# Patient Record
Sex: Male | Born: 1980 | Race: Black or African American | Hispanic: No | Marital: Single | Smoking: Current every day smoker
Health system: Southern US, Community
[De-identification: ages and names within clinical notes are randomized; demographics above are authoritative.]

## PROBLEM LIST (undated history)

## (undated) HISTORY — PX: SKIN GRAFT: SHX250

---

## 2004-12-26 ENCOUNTER — Emergency Department (HOSPITAL_COMMUNITY): Admission: EM | Admit: 2004-12-26 | Discharge: 2004-12-26 | Payer: Self-pay | Admitting: Emergency Medicine

## 2004-12-29 ENCOUNTER — Ambulatory Visit: Payer: Self-pay | Admitting: Orthopedic Surgery

## 2005-01-01 ENCOUNTER — Ambulatory Visit (HOSPITAL_COMMUNITY): Admission: RE | Admit: 2005-01-01 | Discharge: 2005-01-02 | Payer: Self-pay | Admitting: Orthopedic Surgery

## 2005-06-10 ENCOUNTER — Emergency Department (HOSPITAL_COMMUNITY): Admission: EM | Admit: 2005-06-10 | Discharge: 2005-06-10 | Payer: Self-pay | Admitting: Emergency Medicine

## 2005-08-11 ENCOUNTER — Emergency Department (HOSPITAL_COMMUNITY): Admission: EM | Admit: 2005-08-11 | Discharge: 2005-08-11 | Payer: Self-pay | Admitting: Emergency Medicine

## 2005-08-30 ENCOUNTER — Ambulatory Visit: Payer: Self-pay | Admitting: Family Medicine

## 2006-06-20 ENCOUNTER — Encounter (INDEPENDENT_AMBULATORY_CARE_PROVIDER_SITE_OTHER): Payer: Self-pay | Admitting: Family Medicine

## 2006-07-18 ENCOUNTER — Ambulatory Visit: Payer: Self-pay | Admitting: Family Medicine

## 2006-07-21 ENCOUNTER — Encounter (INDEPENDENT_AMBULATORY_CARE_PROVIDER_SITE_OTHER): Payer: Self-pay | Admitting: Family Medicine

## 2006-07-21 LAB — CONVERTED CEMR LAB: WBC, blood: 5.8 10*3/uL

## 2006-07-27 ENCOUNTER — Encounter: Payer: Self-pay | Admitting: Family Medicine

## 2006-07-27 DIAGNOSIS — E669 Obesity, unspecified: Secondary | ICD-10-CM

## 2006-07-27 DIAGNOSIS — F17200 Nicotine dependence, unspecified, uncomplicated: Secondary | ICD-10-CM

## 2006-07-27 DIAGNOSIS — M545 Low back pain: Secondary | ICD-10-CM

## 2006-08-21 ENCOUNTER — Ambulatory Visit: Payer: Self-pay | Admitting: Family Medicine

## 2006-09-05 ENCOUNTER — Emergency Department (HOSPITAL_COMMUNITY): Admission: EM | Admit: 2006-09-05 | Discharge: 2006-09-05 | Payer: Self-pay | Admitting: Emergency Medicine

## 2007-08-16 ENCOUNTER — Encounter: Payer: Self-pay | Admitting: Family Medicine

## 2007-12-07 ENCOUNTER — Ambulatory Visit: Payer: Self-pay | Admitting: Family Medicine

## 2007-12-07 DIAGNOSIS — R197 Diarrhea, unspecified: Secondary | ICD-10-CM

## 2007-12-07 DIAGNOSIS — F341 Dysthymic disorder: Secondary | ICD-10-CM

## 2007-12-07 DIAGNOSIS — F528 Other sexual dysfunction not due to a substance or known physiological condition: Secondary | ICD-10-CM

## 2007-12-07 DIAGNOSIS — F1220 Cannabis dependence, uncomplicated: Secondary | ICD-10-CM | POA: Insufficient documentation

## 2007-12-08 ENCOUNTER — Encounter (INDEPENDENT_AMBULATORY_CARE_PROVIDER_SITE_OTHER): Payer: Self-pay | Admitting: Family Medicine

## 2007-12-08 LAB — CONVERTED CEMR LAB
ALT: 32 units/L (ref 0–53)
AST: 21 units/L (ref 0–37)
Basophils Absolute: 0.1 10*3/uL (ref 0.0–0.1)
Basophils Relative: 1 % (ref 0–1)
Chloride: 108 meq/L (ref 96–112)
Creatinine, Ser: 0.97 mg/dL (ref 0.40–1.50)
Eosinophils Relative: 4 % (ref 0–5)
Hemoglobin: 13.9 g/dL (ref 13.0–17.0)
MCHC: 32.9 g/dL (ref 30.0–36.0)
Monocytes Absolute: 0.6 10*3/uL (ref 0.1–1.0)
Neutro Abs: 2.6 10*3/uL (ref 1.7–7.7)
Platelets: 358 10*3/uL (ref 150–400)
RDW: 12.6 % (ref 11.5–15.5)
Sodium: 140 meq/L (ref 135–145)
Total Bilirubin: 0.6 mg/dL (ref 0.3–1.2)

## 2007-12-10 ENCOUNTER — Telehealth (INDEPENDENT_AMBULATORY_CARE_PROVIDER_SITE_OTHER): Payer: Self-pay | Admitting: *Deleted

## 2007-12-12 ENCOUNTER — Telehealth (INDEPENDENT_AMBULATORY_CARE_PROVIDER_SITE_OTHER): Payer: Self-pay | Admitting: Family Medicine

## 2007-12-21 ENCOUNTER — Encounter (INDEPENDENT_AMBULATORY_CARE_PROVIDER_SITE_OTHER): Payer: Self-pay | Admitting: Family Medicine

## 2007-12-31 ENCOUNTER — Ambulatory Visit: Payer: Self-pay | Admitting: Family Medicine

## 2008-01-02 LAB — CONVERTED CEMR LAB
GC Probe Amp, Urine: NEGATIVE
HCV Ab: NEGATIVE
Hep A IgM: NEGATIVE

## 2008-02-06 ENCOUNTER — Ambulatory Visit: Payer: Self-pay | Admitting: Family Medicine

## 2008-02-11 ENCOUNTER — Telehealth (INDEPENDENT_AMBULATORY_CARE_PROVIDER_SITE_OTHER): Payer: Self-pay | Admitting: Family Medicine

## 2008-02-14 ENCOUNTER — Ambulatory Visit: Payer: Self-pay | Admitting: Family Medicine

## 2008-02-14 DIAGNOSIS — B009 Herpesviral infection, unspecified: Secondary | ICD-10-CM | POA: Insufficient documentation

## 2008-05-07 ENCOUNTER — Ambulatory Visit: Payer: Self-pay | Admitting: Family Medicine

## 2008-05-07 DIAGNOSIS — M79609 Pain in unspecified limb: Secondary | ICD-10-CM

## 2009-10-23 ENCOUNTER — Emergency Department (HOSPITAL_COMMUNITY): Admission: EM | Admit: 2009-10-23 | Discharge: 2009-10-23 | Payer: Self-pay | Admitting: Emergency Medicine

## 2010-09-14 NOTE — Letter (Signed)
Summary: rpc chart  rpc chart   Imported By: Curtis Sites 05/24/2010 11:37:09  _____________________________________________________________________  External Attachment:    Type:   Image     Comment:   External Document

## 2010-12-31 NOTE — Op Note (Signed)
NAMEKEIMARI, Regino Ramirez           ACCOUNT NO.:  [id number]   MEDICAL RECORD NO.:  [id number]          PATIENT TYPE:  OIB   LOCATION:  5022                         FACILITY:  [facility]   PHYSICIAN:  Dionne Ano. Gramig III, M.D.DATE OF BIRTH:  07/03/1981   DATE OF PROCEDURE:  DATE OF DISCHARGE:  01/02/2005                                 OPERATIVE REPORT   PREOPERATIVE DIAGNOSIS:  Severe avulsive injury, right small finger proximal  interphalangeal joint, with 1 cm loss of extensor tendon, open proximal  interphalangeal joint, loss of dorsal skin and subcutaneous tissue.   POSTOPERATIVE DIAGNOSIS:  Severe avulsive injury, right small finger  proximal interphalangeal joint, with 1 cm loss of extensor tendon, open  proximal interphalangeal joint, loss of dorsal skin and subcutaneous tissue.   PROCEDURES:  1.  Irrigation and debridement, right small finger proximal interphalangeal      joint, skin and subcutaneous tissue, tendon, and open proximal      interphalangeal joint.  2.  Right small finger zone 3 tendon repair with palmaris longus tendon      graft.  The palmaris longus tendon graft was obtained from the distal      forearm.  3.  Right small finger transposition flap with 2 x 1 cm full-thickness skin      graft being placed in the proximal transposed area.  This was a dorsal      proximal and distal flap.  4.  Right small finger transposition flap with full-thickness skin graft      __________ a 1 x 1 cm defect, which was a distal to proximal flap (two      distinct and separate transposition flaps were performed in order to      salvage the digit and get primary coverage for the proximal      interphalangeal joint, which had noted avulsed tissue).  5.  Pinning, right small finger.  6.  Stress radiography/fluoroscopy.  7.  Skin grafting, full-thickness in nature, 2 x 1 cm proximal area and a 1      x 2 cm distal area where the two transposition flaps were performed.   SURGEON:   Dionne Ano. Amanda Pea, M.D.   ASSISTANT:  None.   COMPLICATIONS:  None.   ANESTHESIA:  General.   SPECIMENS:  None.   INDICATION FOR PROCEDURE:  This patient is a very pleasant male who presents  with the above-mentioned diagnosis.  I have counseled him in regard to risks  and benefits of surgery, including risk of infection, bleeding, anesthesia,  damage to normal structures and failure of the surgery to accomplish its  intended goals of relieving symptoms and restoring function.  With this in  mind, he desires to proceed.  All questions have been encouraged and  answered preoperatively.  This patient was initially triaged at [facility].  The patient was seen by Vickki Hearing, M.D., and referred  for our care for upper extremity salvage.  I have discussed with the patient  all risks and benefits, including loss of digit, infection, chronic pain,  stiffness, and other problems.  Unfortunately, he has  had a delay and an  open PIP joint for quite some time.  Immediately upon seeing him, I I&D'd  him, placed him on antibiotics and set him up for this reconstructive  effort.   OPERATIVE PROCEDURE IN DETAIL:  He was seen by myself and anesthesia,  consent was signed.  He was taken to the operative suite, given preoperative  antibiotics and once this was done was placed supine, underwent a general  anesthesia under the direction of the anesthesia department, and a sterile  field was secured after appropriate padding and surgical preparation.  Once  a sterile field was secured, the patient underwent I&D of the PIP joint,  tendon and subcu and skin.  There was a large amount of devitalized tissue  which was removed.  This created a 1.5 x 1.5 cm defect.  Multiple washes  were performed and greater than 2.5-3 L were placed in the wound.  Once this  was done, towels were changed and the patient then underwent evaluation of  the tissue.  I noted immediately that the patient would  require a  transposition flap, and thus the first flap was created by incising  laterally and ulnarly about the finger.  This was brought up to just shy of  the MCP level and then I made a back cut radially toward the midlateral  line.  I mobilized this, taking care to preserve the peritenon.  This was  able to get across almost two-thirds of the defect.  This left a 2 x 1 cm  area which would require full-thickness skin grafting.  I then made a second  transposition flap distally.  This was distal to the defect, and an incision  was made radially about the dorsal aspect of the small finger and then a  back cut toward the midlateral line ulnarly was performed.  This was  elevated and taken off of the peritenon.  Once this was done, I then  mobilized it and I then noted that I could cover the wound fairly nicely.  Once this was done, I then turned attention toward the joint and reduced it  and placed a 0.045 K-wire across it.  This was a pinning of the right small  finger PIP joint as treatment for subluxation and dislocation tendency of  the small finger due to the extensor loss.  Once this was done, the patient  had attention turned toward the volar forearm.  I placed a tourniquet,  irrigated copiously, and placed wet laps on the small finger.  I then pin-  capped, checked it under fluoroscopy.  I was pleased with this and then made  an incision volarly about the distal third of the forearm.  I dissected  down.  In doing so, the incision removed a 2.5 x 3 cm area of skin.  I then  dissected out the palmaris longus, obtained a tendon graft.  This was a two-  thirds portion of the palmaris longus.  Once this was done, I then placed  this in a wet lap and moved back to the main field.  Prior to doing so, I  should note I irrigated copiously and closed the volar wound with  interrupted Vicryl, followed by Prolene.  Once this was done, I then performed a tendon graft repair in zone 3 by filling  in the defects with  palmaris longus graft, which was able to be looped 2-1/2 times through the  defect.  This was inset with tendon braider into  the proximal and distal  aspects of the extensor apparatus and sutured with Fibrewire.  Unfortunately, the patient did have periosteum exposed and thus will be at  high risk for tendon adhesions.  Once this was done and sutured to my  satisfaction, I then closed the transposition flaps primarily with Prolene  and then contoured the full-thickness skin grafts by defatting them and  performing a full-thickness skin graft, 2 x 1 cm proximally and 1 x 1 cm  distally.  This skin graft was defatted.  It was perforated to allow for the  egress of fluids, and it was sutured down with Prolene and chromic, followed  by a bolster dressing with Xeroform followed soaked cotton balls.  The  patient tolerated this well, and there were no complicating features.  This  will hopefully take nicely.  The peritenon was preserved.  The transposition  flaps x2 covered the defect fairly nicely.  All flaps had good refill, and  there were no complicating features.  Once this was done and the circulation  looked excellent to the finger, I placed him in a sterile dressing and a  volar plaster splint with an additional finger splint volarly applied for  mobilization purposes.  He tolerated the procedure well, awoke from  anesthesia without complicating features, and was taken to the recovery  room.  He will be monitored here and observed overnight.  He will be placed  on IV antibiotics, one aspirin a day.  I will ask him to stop smoking and  have discussed this with him previously.  In addition to this, will have  pain medicine according to his needs, etc.  All questions have been  encouraged and answered.      WMG/MEDQ  D:  01/03/2005  T:  01/03/2005  Job:  [id number]

## 2016-09-15 ENCOUNTER — Encounter: Payer: Self-pay | Admitting: Medical

## 2018-08-22 DIAGNOSIS — F17210 Nicotine dependence, cigarettes, uncomplicated: Secondary | ICD-10-CM | POA: Insufficient documentation

## 2018-08-22 DIAGNOSIS — N200 Calculus of kidney: Secondary | ICD-10-CM | POA: Insufficient documentation

## 2018-08-23 ENCOUNTER — Emergency Department (HOSPITAL_COMMUNITY): Payer: Self-pay

## 2018-08-23 ENCOUNTER — Encounter (HOSPITAL_COMMUNITY): Payer: Self-pay | Admitting: *Deleted

## 2018-08-23 ENCOUNTER — Emergency Department (HOSPITAL_COMMUNITY)
Admission: EM | Admit: 2018-08-23 | Discharge: 2018-08-23 | Disposition: A | Discharge status: Home / Self Care | Payer: Self-pay | Attending: Emergency Medicine | Admitting: Emergency Medicine

## 2018-08-23 ENCOUNTER — Other Ambulatory Visit: Payer: Self-pay

## 2018-08-23 DIAGNOSIS — N200 Calculus of kidney: Secondary | ICD-10-CM

## 2018-08-23 LAB — URINALYSIS, ROUTINE W REFLEX MICROSCOPIC
Bacteria, UA: NONE SEEN
Bilirubin Urine: NEGATIVE
GLUCOSE, UA: NEGATIVE mg/dL
KETONES UR: NEGATIVE mg/dL
Leukocytes, UA: NEGATIVE
NITRITE: NEGATIVE
Protein, ur: NEGATIVE mg/dL
Specific Gravity, Urine: 1.017 (ref 1.005–1.030)
pH: 5 (ref 5.0–8.0)

## 2018-08-23 NOTE — Discharge Instructions (Addendum)
Return to the emergency department if you develop fever, worsening pain, or other new and concerning symptoms.

## 2018-08-23 NOTE — ED Provider Notes (Signed)
[facility] EMERGENCY DEPARTMENT Provider Note   CSN: [id number] Arrival date & time: 08/22/18  2348     History   Chief Complaint Chief Complaint  Patient presents with  . Testicle Pain    HPI Christopher York is a 38 y.o. male.  Patient is a 38 year old male with no significant past medical history.  He presents today for evaluation of abdominal and testicle pain.  He states this morning while he was at work he developed sharp pain in his testicles, followed by pain radiating to his right flank.  This is occurred intermittently throughout the day and presents for evaluation of it.  He denies any dysuria or hematuria.  He denies any fevers or chills.  The history is provided by the patient.  Testicle Pain  This is a new problem. Episode onset: 9 AM. Episode frequency: Intermittently. Associated symptoms include abdominal pain. Nothing aggravates the symptoms. Nothing relieves the symptoms. He has tried nothing for the symptoms.    History reviewed. No pertinent past medical history.  Patient Active Problem List   Diagnosis Date Noted  . FINGER PAIN 05/07/2008  . COLD SORE 02/14/2008  . ANXIETY DEPRESSION 12/07/2007  . ERECTILE DYSFUNCTION 12/07/2007  . CANNABIS DEPENDENCE UNSPECIFIED ABUSE 12/07/2007  . LOOSE STOOLS 12/07/2007  . OBESITY NOS 07/27/2006  . DISORDER, TOBACCO USE 07/27/2006  . LOW BACK PAIN 07/27/2006    Past Surgical History:  Procedure Laterality Date  . SKIN GRAFT          Home Medications    Prior to Admission medications   Not on File    Family History History reviewed. No pertinent family history.  Social History Social History   Tobacco Use  . Smoking status: Current Every Day Smoker    Packs/day: 0.50  . Smokeless tobacco: Never Used  Substance Use Topics  . Alcohol use: Yes    Comment: occasionally  . Drug use: Yes    Types: Marijuana     Allergies   Penicillins   Review of Systems Review of Systems    Gastrointestinal: Positive for abdominal pain.  Genitourinary: Positive for testicular pain.  All other systems reviewed and are negative.    Physical Exam Updated Vital Signs BP (!) 147/101 (BP Location: Right Arm)   Pulse 61   Temp 98.2 F (36.8 C) (Oral)   Resp 18   Ht 5\' 9"  (1.753 m)   Wt 107.5 kg   SpO2 100%   BMI 35.00 kg/m   Physical Exam Vitals signs and nursing note reviewed.  Constitutional:      General: He is not in acute distress.    Appearance: He is well-developed. He is not diaphoretic.  HENT:     Head: Normocephalic and atraumatic.  Neck:     Musculoskeletal: Normal range of motion and neck supple.  Cardiovascular:     Rate and Rhythm: Normal rate and regular rhythm.     Heart sounds: No murmur. No friction rub.  Pulmonary:     Effort: Pulmonary effort is normal. No respiratory distress.     Breath sounds: Normal breath sounds. No wheezing or rales.  Abdominal:     General: Bowel sounds are normal. There is no distension.     Palpations: Abdomen is soft.     Tenderness: There is no abdominal tenderness.  Genitourinary:    Penis: Normal.      Scrotum/Testes: Normal.     Comments: External genitalia is normal in appearance.  There is no  swelling.  I am unable to palpate any hernia.  Testes are freely mobile and are nontender. Musculoskeletal: Normal range of motion.  Skin:    General: Skin is warm and dry.  Neurological:     Mental Status: He is alert and oriented to person, place, and time.     Coordination: Coordination normal.      ED Treatments / Results  Labs (all labs ordered are listed, but only abnormal results are displayed) Labs Reviewed  URINALYSIS, ROUTINE W REFLEX MICROSCOPIC  GC/CHLAMYDIA PROBE AMP ([place]) NOT AT [facility]    EKG None  Radiology No results found.  Procedures Procedures (including critical care time)  Medications Ordered in ED Medications - No data to display   Initial Impression / Assessment and  Plan / ED Course  I have reviewed the triage vital signs and the nursing notes.  Pertinent labs & imaging results that were available during my care of the patient were reviewed by me and considered in my medical decision making (see chart for details).  Patient presents here with complaints of testicular pain radiating to his right flank.  Based on the results of the CT scan and urinalysis, it would appear as though he had a renal calculus that appears to be now in the bladder.  He is now comfortable and not complaining of any discomfort.  At this point, I feel as though he is appropriate for discharge.  To follow-up as needed for any problems.  Final Clinical Impressions(s) / ED Diagnoses   Final diagnoses:  None    ED Discharge Orders    None       Geoffery Lyons, MD 08/23/18 [phone]

## 2018-08-23 NOTE — ED Triage Notes (Signed)
Pt states he was at work when he had a severe pain to his right testicle; pt states the pain went away but came back and is now having right side abdominal pain with the testicle

## 2018-08-24 LAB — GC/CHLAMYDIA PROBE AMP (~~LOC~~) NOT AT ARMC
Chlamydia: NEGATIVE
Neisseria Gonorrhea: NEGATIVE

## 2019-05-22 ENCOUNTER — Emergency Department (HOSPITAL_COMMUNITY)
Admission: EM | Admit: 2019-05-22 | Discharge: 2019-05-22 | Disposition: A | Discharge status: Home / Self Care | Payer: Self-pay | Attending: Emergency Medicine | Admitting: Emergency Medicine

## 2019-05-22 ENCOUNTER — Encounter (HOSPITAL_COMMUNITY): Payer: Self-pay | Admitting: Emergency Medicine

## 2019-05-22 ENCOUNTER — Other Ambulatory Visit: Payer: Self-pay

## 2019-05-22 DIAGNOSIS — L0231 Cutaneous abscess of buttock: Secondary | ICD-10-CM | POA: Insufficient documentation

## 2019-05-22 DIAGNOSIS — F17210 Nicotine dependence, cigarettes, uncomplicated: Secondary | ICD-10-CM | POA: Insufficient documentation

## 2019-05-22 MED ORDER — SULFAMETHOXAZOLE-TRIMETHOPRIM 800-160 MG PO TABS: 1.0000 | ORAL_TABLET | Freq: Two times a day (BID) | ORAL | 0 refills | Status: AC

## 2019-05-22 MED ORDER — OXYCODONE-ACETAMINOPHEN 5-325 MG PO TABS
1.0000 | ORAL_TABLET | Freq: Once | ORAL | Status: AC
  Administered 2019-05-22: 22:00:00 1 via ORAL
  Filled 2019-05-22: qty 1

## 2019-05-22 MED ORDER — SULFAMETHOXAZOLE-TRIMETHOPRIM 800-160 MG PO TABS
1.0000 | ORAL_TABLET | Freq: Once | ORAL | Status: AC
  Administered 2019-05-22: 22:00:00 1 via ORAL
  Filled 2019-05-22: qty 1

## 2019-05-22 MED ORDER — HYDROCODONE-ACETAMINOPHEN 5-325 MG PO TABS: 1.0000 | ORAL_TABLET | ORAL | 0 refills | Status: AC | PRN

## 2019-05-22 NOTE — Discharge Instructions (Signed)
Soak area 20 minutes 4 times a day  return if any problems.

## 2019-05-22 NOTE — ED Provider Notes (Signed)
[facility] EMERGENCY DEPARTMENT Provider Note   CSN: [id number] Arrival date & time: 05/22/19  2055     History   Chief Complaint Chief Complaint  Patient presents with  . Abscess    HPI Christopher York is a 38 y.o. male.     The history is provided by the patient. No language interpreter was used.  Abscess Location:  Pelvis Pelvic abscess location:  L buttock Size:  2 Abscess quality: draining and painful   Red streaking: no   Progression:  Worsening Pain details:    Quality:  No pain   Severity:  Moderate   Timing:  Constant   Progression:  Worsening Chronicity:  New Relieved by:  Nothing Worsened by:  Nothing Ineffective treatments:  None tried Associated symptoms: no nausea   Pt reports he has a swollen area on his buttock.  Pt reports he thinks something may have bitten him.    History reviewed. No pertinent past medical history.  Patient Active Problem List   Diagnosis Date Noted  . FINGER PAIN 05/07/2008  . COLD SORE 02/14/2008  . ANXIETY DEPRESSION 12/07/2007  . ERECTILE DYSFUNCTION 12/07/2007  . CANNABIS DEPENDENCE UNSPECIFIED ABUSE 12/07/2007  . LOOSE STOOLS 12/07/2007  . OBESITY NOS 07/27/2006  . DISORDER, TOBACCO USE 07/27/2006  . LOW BACK PAIN 07/27/2006    Past Surgical History:  Procedure Laterality Date  . SKIN GRAFT          Home Medications    Prior to Admission medications   Medication Sig Start Date End Date Taking? Authorizing Provider  HYDROcodone-acetaminophen (NORCO/VICODIN) 5-325 MG tablet Take 1 tablet by mouth every 4 (four) hours as needed. 05/22/19   Elson Areas, PA-C  sulfamethoxazole-trimethoprim (BACTRIM DS) 800-160 MG tablet Take 1 tablet by mouth 2 (two) times daily for 7 days. 05/22/19 05/29/19  Elson Areas, PA-C    Family History No family history on file.  Social History Social History   Tobacco Use  . Smoking status: Current Every Day Smoker    Packs/day: 0.50  . Smokeless tobacco: Never  Used  Substance Use Topics  . Alcohol use: Yes    Comment: occasionally  . Drug use: Not Currently    Types: Marijuana     Allergies   Penicillins   Review of Systems Review of Systems  Gastrointestinal: Negative for nausea.  All other systems reviewed and are negative.    Physical Exam Updated Vital Signs BP (!) 139/93 (BP Location: Right Arm)   Pulse (!) 110   Temp (!) 100.5 F (38.1 C) (Oral)   Resp 18   Ht 5\' 9"  (1.753 m)   Wt 98.9 kg   SpO2 100%   BMI 32.19 kg/m   Physical Exam Vitals signs and nursing note reviewed.  Constitutional:      Appearance: He is well-developed.  HENT:     Head: Normocephalic.  Neck:     Musculoskeletal: Normal range of motion.  Cardiovascular:     Rate and Rhythm: Normal rate.  Pulmonary:     Effort: Pulmonary effort is normal.  Abdominal:     General: There is no distension.  Musculoskeletal:        General: Swelling present.     Comments: 2cm swollen tender area left buttock,  Oozing,  Oozing increases with pressure   Neurological:     Mental Status: He is alert and oriented to person, place, and time.      ED Treatments / Results  Labs (  all labs ordered are listed, but only abnormal results are displayed) Labs Reviewed - No data to display  EKG None  Radiology No results found.  Procedures Procedures (including critical care time)  Medications Ordered in ED Medications  oxyCODONE-acetaminophen (PERCOCET/ROXICET) 5-325 MG per tablet 1 tablet (1 tablet Oral Given 05/22/19 2222)  sulfamethoxazole-trimethoprim (BACTRIM DS) 800-160 MG per tablet 1 tablet (1 tablet Oral Given 05/22/19 2222)     Initial Impression / Assessment and Plan / ED Course  I have reviewed the triage vital signs and the nursing notes.  Pertinent labs & imaging results that were available during my care of the patient were reviewed by me and considered in my medical decision making (see chart for details).        MDM  Pt counseled  on infection.  Pt advised to soak 20 minutes 4 times a day.  Pt given bactrim here and rx   Final Clinical Impressions(s) / ED Diagnoses   Final diagnoses:  Abscess of left buttock    ED Discharge Orders         Ordered    sulfamethoxazole-trimethoprim (BACTRIM DS) 800-160 MG tablet  2 times daily     05/22/19 2218    HYDROcodone-acetaminophen (NORCO/VICODIN) 5-325 MG tablet  Every 4 hours PRN     05/22/19 2218        An After Visit Summary was printed and given to the patient.    Fransico Meadow, PA-C 05/22/19 2251    Julianne Rice, MD 05/22/19 [phone]

## 2019-05-22 NOTE — ED Triage Notes (Signed)
Pt c/o abscess to the left buttocks x2 days.

## 2019-06-11 IMAGING — CT CT RENAL STONE PROTOCOL
2 of 3 series · 16 of 46 positions shown, 18 images · non-contrast
Comparison: None available.

CLINICAL DATA: Initial evaluation for acute flank pain, stone
disease suspected.

EXAM:
CT ABDOMEN AND PELVIS WITHOUT CONTRAST
TECHNIQUE: Multidetector CT imaging of the abdomen and pelvis was performed
following the standard protocol without IV contrast.

[Series 2: axial st · axial · 0.89mm/px · z∈[+1233,+1668]mm · 13 of 101 slices shown, 15 images]
[im 7/101  soft-tissue]
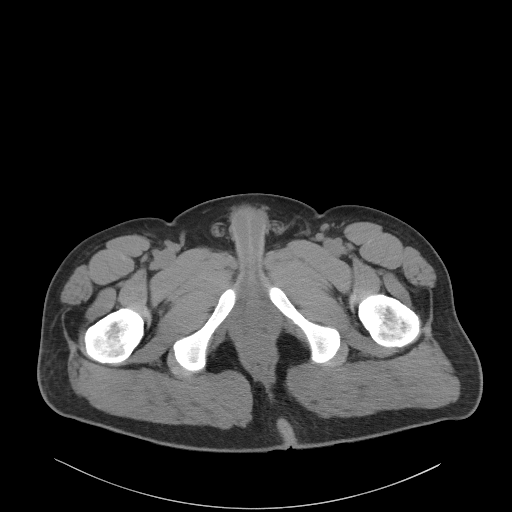
[im 7/101  bone]
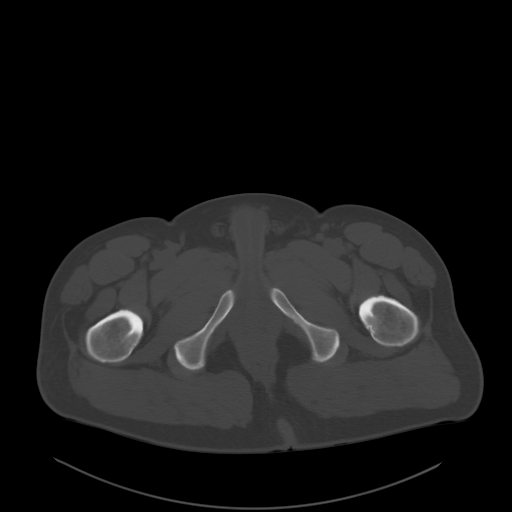
[im 13/101  soft-tissue]
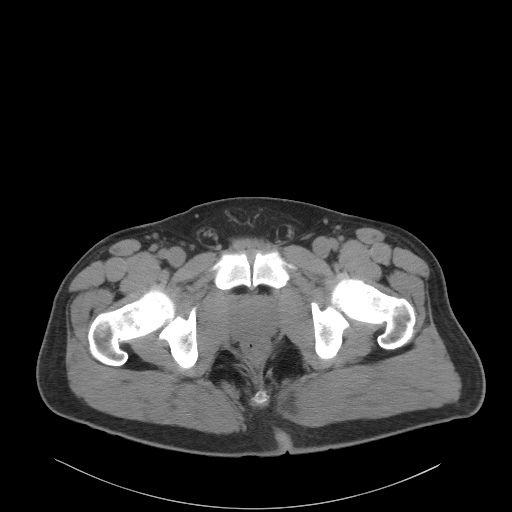
[im 20/101  soft-tissue]
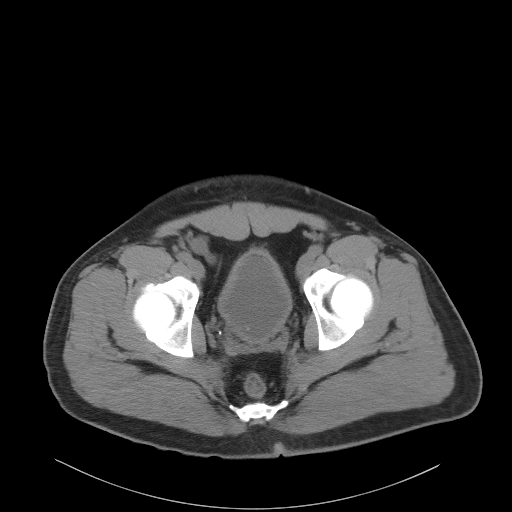
[im 30/101  soft-tissue]
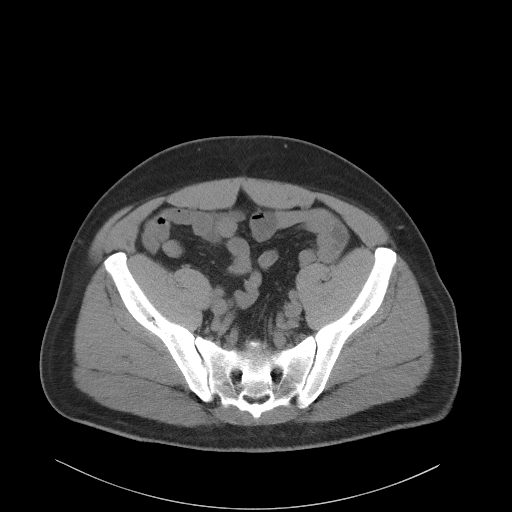
[im 36/101  soft-tissue]
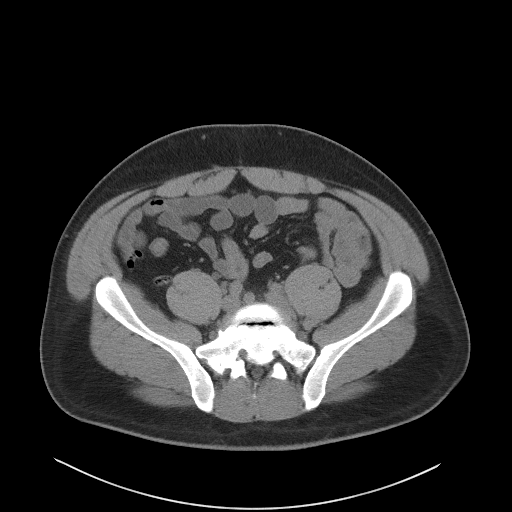
[im 42/101  soft-tissue]
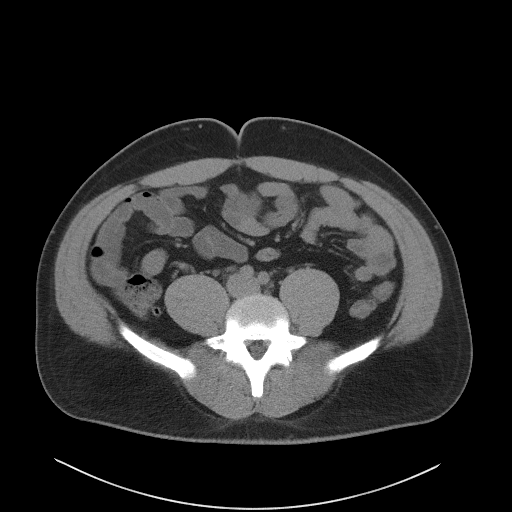
[im 52/101  soft-tissue]
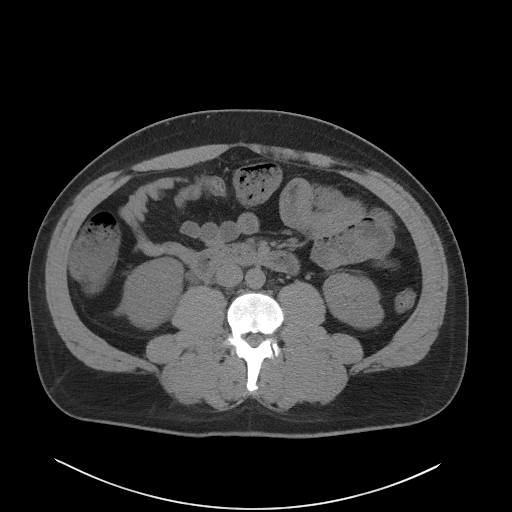
[im 59/101  soft-tissue]
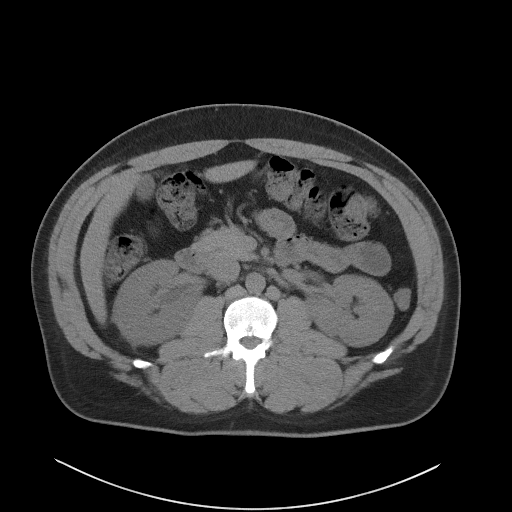
[im 65/101  soft-tissue]
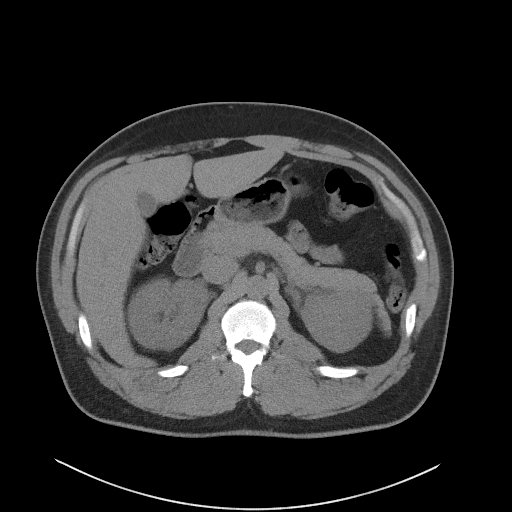
[im 65/101  bone]
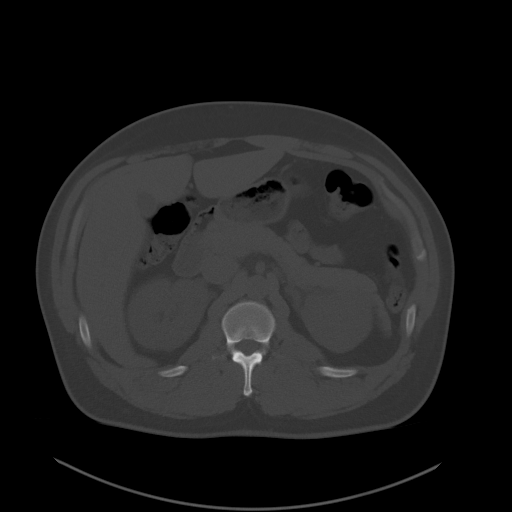
[im 71/101  soft-tissue]
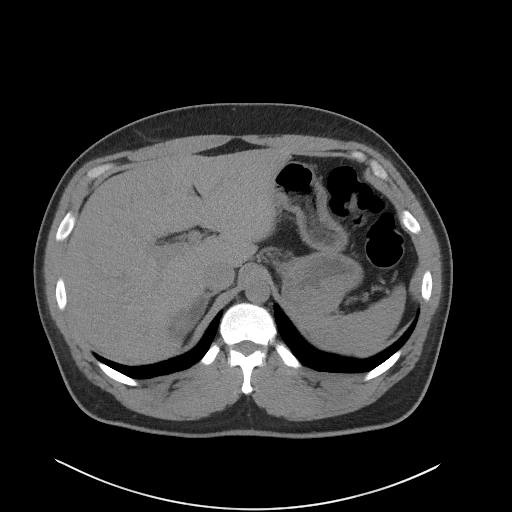
[im 81/101  soft-tissue]
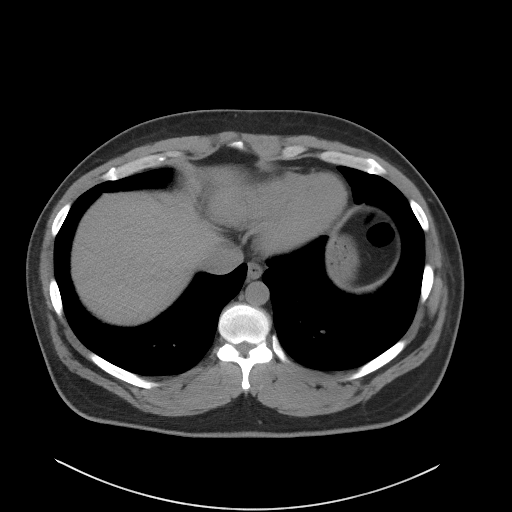
[im 88/101  soft-tissue]
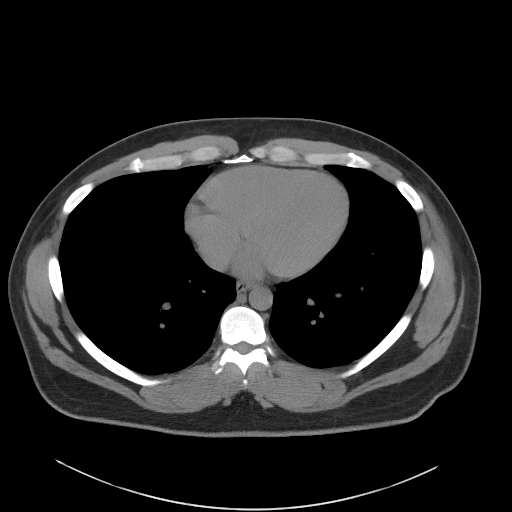
[im 94/101  soft-tissue]
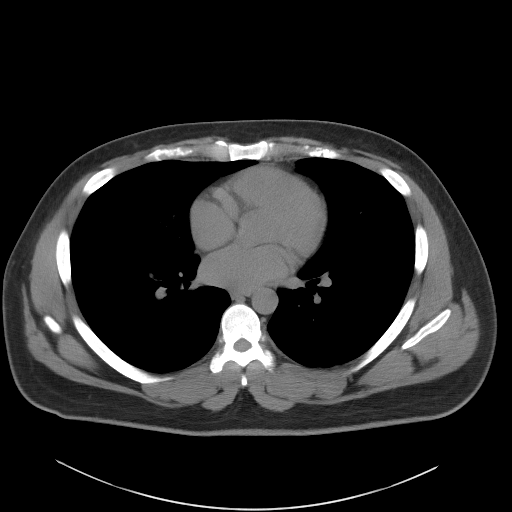

[Series 5: coronal st · coronal · 0.85mm/px · 3 of 101 slices shown]
[im 34/101  soft-tissue]
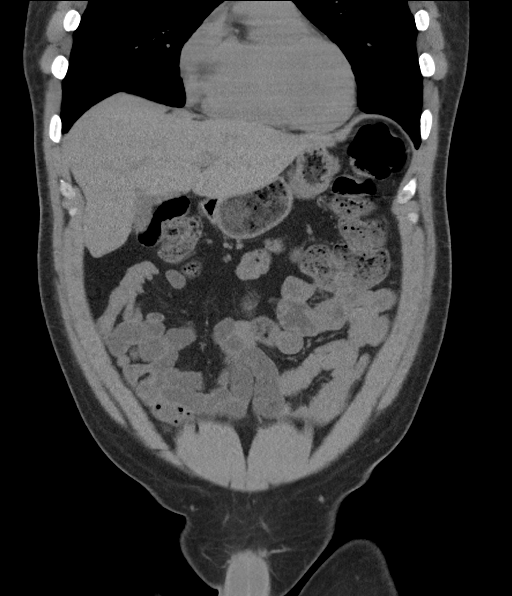
[im 45/101  soft-tissue]
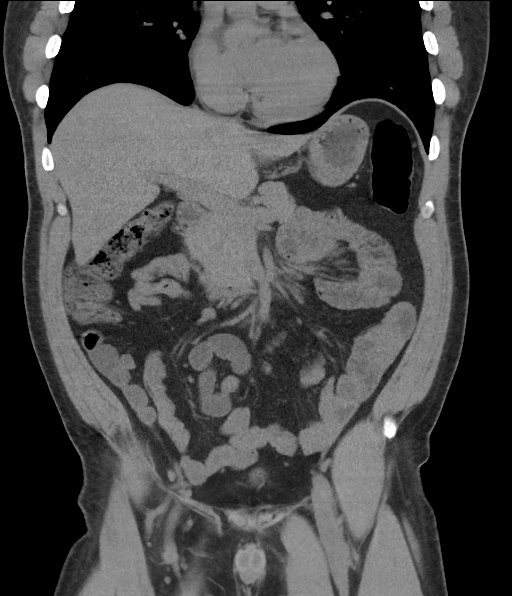
[im 56/101  soft-tissue]
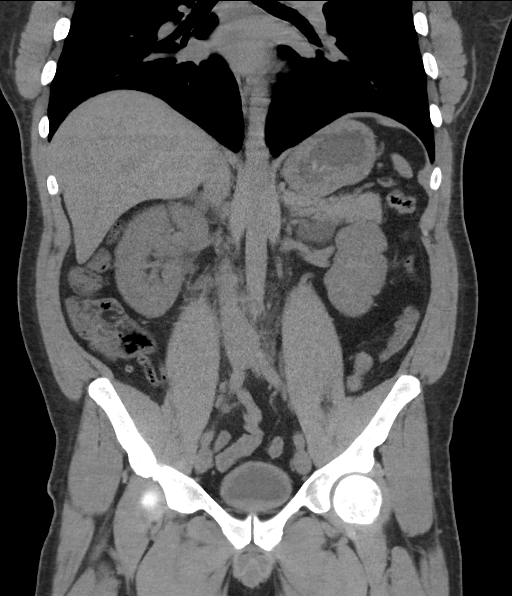

[16 of 46 positions shown; findings below may reference images not displayed]

FINDINGS: Lower chest: Mild subsegmental atelectatic changes seen dependently
within the visualized lung bases. Visualized lungs are otherwise
clear.

Hepatobiliary: Liver demonstrates a normal unenhanced appearance.
Gallbladder within normal limits. No biliary dilatation.

Pancreas: Pancreas within normal limits.

Spleen: Unremarkable.

Adrenals/Urinary Tract: Adrenal glands within normal limits.

3 mm stone seen layering within the right posterior bladder lumen
with secondary mild right hydroureteronephrosis, consistent with a
recently passed stone. Mild perinephric and periureteral fat
stranding. No other radiopaque calculi seen within the right kidney
or along the course of the right renal collecting system. Left
kidney unremarkable without nephrolithiasis or hydronephrosis. No
radiopaque calculi seen along the course of the left renal
collecting system. No left-sided hydroureter. Subcentimeter
hypodensity at the upper pole the right kidney not well assessed on
this noncontrast examination, but statistically likely reflects
small cyst.

Partially distended bladder otherwise unremarkable. No other
layering stones within the bladder lumen.

Stomach/Bowel: Stomach within normal limits. No evidence for bowel
obstruction. Normal appendix. No acute inflammatory changes seen
about the bowels.

Vascular/Lymphatic: Intra-abdominal aorta of normal caliber. No
adenopathy.

Reproductive: Normal prostate.

Other: No free air or fluid.

Musculoskeletal: No acute osseous abnormality. No discrete lytic or
blastic osseous lesions. Moderate degenerative spondylolysis with
disc desiccation noted at L5-S1.
IMPRESSION: 1. 3 mm stone layering within the right posterior bladder lumen with
secondary mild right hydroureteronephrosis, consistent with a
recently passed stone. No other radiopaque calculi identified within
either kidney or along the course of either renal collecting system.
2. No other acute intra-abdominal or pelvic process.

## 2019-10-08 ENCOUNTER — Other Ambulatory Visit: Payer: Self-pay

## 2020-05-19 ENCOUNTER — Emergency Department (HOSPITAL_COMMUNITY)
Admission: EM | Admit: 2020-05-19 | Discharge: 2020-05-19 | Disposition: A | Discharge status: Home / Self Care | Payer: Self-pay | Attending: Emergency Medicine | Admitting: Emergency Medicine

## 2020-05-19 ENCOUNTER — Other Ambulatory Visit: Payer: Self-pay

## 2020-05-19 ENCOUNTER — Encounter (HOSPITAL_COMMUNITY): Payer: Self-pay

## 2020-05-19 DIAGNOSIS — F17200 Nicotine dependence, unspecified, uncomplicated: Secondary | ICD-10-CM | POA: Insufficient documentation

## 2020-05-19 DIAGNOSIS — H1031 Unspecified acute conjunctivitis, right eye: Secondary | ICD-10-CM | POA: Insufficient documentation

## 2020-05-19 MED ORDER — FLUORESCEIN SODIUM 1 MG OP STRP
1.0000 | ORAL_STRIP | Freq: Once | OPHTHALMIC | Status: AC
  Administered 2020-05-19: 1 via OPHTHALMIC
  Filled 2020-05-19: qty 1

## 2020-05-19 MED ORDER — ERYTHROMYCIN 5 MG/GM OP OINT: TOPICAL_OINTMENT | OPHTHALMIC | 0 refills | Status: AC

## 2020-05-19 MED ORDER — TETRACAINE HCL 0.5 % OP SOLN
2.0000 [drp] | Freq: Once | OPHTHALMIC | Status: AC
  Administered 2020-05-19: 2 [drp] via OPHTHALMIC
  Filled 2020-05-19: qty 4

## 2020-05-19 NOTE — ED Triage Notes (Signed)
Patient c/oright eye pain and drainage x 2 days.

## 2020-05-19 NOTE — Discharge Instructions (Addendum)
You were seen in the emergency department for red watery right eye.  The pressure in your eye was okay so this is unlikely to be glaucoma.  This is probably bacterial conjunctivitis and we are prescribing you some antibiotic ointment to use.  Please wash your hands frequently and try not to rub your eyes.  Return to the emergency department for any worsening or concerning symptoms.

## 2020-05-19 NOTE — ED Provider Notes (Signed)
[facility]-EMERGENCY DEPT Provider Note   CSN: [id number] Arrival date & time: 05/19/20  1654     History Chief Complaint  Patient presents with  . Eye Pain    Christopher York is a 39 y.o. male.  He is here with a complaint of a red and itchy right eye with some watery discharge.  Little bit of pain on the inner side.  No sick contacts.  No fever.  No change in his vision although a little bit watery but when he blinks it is back to normal.  No headache.  The history is provided by the patient.  Eye Pain This is a new problem. The current episode started 2 days ago. The problem occurs constantly. The problem has not changed since onset.Pertinent negatives include no chest pain, no abdominal pain, no headaches and no shortness of breath. Nothing aggravates the symptoms. Nothing relieves the symptoms. He has tried nothing for the symptoms. The treatment provided no relief.       History reviewed. No pertinent past medical history.  Patient Active Problem List   Diagnosis Date Noted  . FINGER PAIN 05/07/2008  . COLD SORE 02/14/2008  . ANXIETY DEPRESSION 12/07/2007  . ERECTILE DYSFUNCTION 12/07/2007  . CANNABIS DEPENDENCE UNSPECIFIED ABUSE 12/07/2007  . LOOSE STOOLS 12/07/2007  . OBESITY NOS 07/27/2006  . DISORDER, TOBACCO USE 07/27/2006  . LOW BACK PAIN 07/27/2006    Past Surgical History:  Procedure Laterality Date  . SKIN GRAFT         Family History  Problem Relation Age of Onset  . Dementia Mother   . Cirrhosis Mother   . Diabetes Father     Social History   Tobacco Use  . Smoking status: Current Every Day Smoker    Packs/day: 1.00  . Smokeless tobacco: Never Used  Vaping Use  . Vaping Use: Never used  Substance Use Topics  . Alcohol use: Yes    Comment: occasionally  . Drug use: Not Currently    Types: Marijuana    Home Medications Prior to Admission medications   Medication Sig Start Date End Date Taking? Authorizing  Provider  HYDROcodone-acetaminophen (NORCO/VICODIN) 5-325 MG tablet Take 1 tablet by mouth every 4 (four) hours as needed. 05/22/19   Elson Areas, PA-C    Allergies    Penicillins  Review of Systems   Review of Systems  Constitutional: Negative for fever.  HENT: Negative for facial swelling.   Eyes: Positive for pain, discharge, redness and itching. Negative for photophobia and visual disturbance.  Respiratory: Negative for shortness of breath.   Cardiovascular: Negative for chest pain.  Gastrointestinal: Negative for abdominal pain.  Neurological: Negative for headaches.    Physical Exam Updated Vital Signs BP (!) 144/94 (BP Location: Left Arm)   Pulse 100   Temp 98.7 F (37.1 C) (Oral)   Resp 16   Ht 5\' 9"  (1.753 m)   Wt 103.4 kg   SpO2 98%   BMI 33.67 kg/m   Physical Exam Vitals and nursing note reviewed.  Constitutional:      Appearance: Normal appearance. He is well-developed.  HENT:     Head: Normocephalic and atraumatic.  Eyes:     General: No scleral icterus.       Right eye: Discharge present. No foreign body.     Intraocular pressure: Right eye pressure is 8 mmHg.     Extraocular Movements: Extraocular movements intact.     Conjunctiva/sclera:  Right eye: Right conjunctiva is injected.     Pupils: Pupils are equal, round, and reactive to light.     Comments: Fluorescein with no uptake.  Pulmonary:     Effort: Pulmonary effort is normal.  Musculoskeletal:     Cervical back: Neck supple.  Skin:    General: Skin is warm and dry.  Neurological:     Mental Status: He is alert.     GCS: GCS eye subscore is 4. GCS verbal subscore is 5. GCS motor subscore is 6.     ED Results / Procedures / Treatments   Labs (all labs ordered are listed, but only abnormal results are displayed) Labs Reviewed - No data to display  EKG None  Radiology No results found.  Procedures Procedures (including critical care time)  Medications Ordered in  ED Medications  fluorescein ophthalmic strip 1 strip (has no administration in time range)  tetracaine (PONTOCAINE) 0.5 % ophthalmic solution 2 drop (has no administration in time range)    ED Course  I have reviewed the triage vital signs and the nursing notes.  Pertinent labs & imaging results that were available during my care of the patient were reviewed by me and considered in my medical decision making (see chart for details).    MDM Rules/Calculators/A&P                         Patient presenting with red itchy watery right eye.  No known foreign body.  Differential includes conjunctivitis, foreign body, glaucoma.  Pressures not elevated.  No foreign body visualized on fluorescein exam.  Will cover with topical erythromycin.  Return instructions discussed.  Final Clinical Impression(s) / ED Diagnoses Final diagnoses:  Acute conjunctivitis of right eye, unspecified acute conjunctivitis type    Rx / DC Orders ED Discharge Orders         Ordered    erythromycin ophthalmic ointment        05/19/20 1954           Terrilee Files, MD 05/20/20 1026
